# Patient Record
Sex: Male | Born: 1985 | Race: Black or African American | Hispanic: No | Marital: Single | Smoking: Former smoker
Health system: Southern US, Community
[De-identification: ages and names within clinical notes are randomized; demographics above are authoritative.]

---

## 2004-12-02 ENCOUNTER — Emergency Department (HOSPITAL_COMMUNITY): Admission: EM | Admit: 2004-12-02 | Discharge: 2004-12-02 | Payer: Self-pay | Admitting: Emergency Medicine

## 2006-10-26 ENCOUNTER — Emergency Department (HOSPITAL_COMMUNITY): Admission: EM | Admit: 2006-10-26 | Discharge: 2006-10-26 | Payer: Self-pay | Admitting: Emergency Medicine

## 2007-06-19 ENCOUNTER — Encounter: Admission: RE | Admit: 2007-06-19 | Discharge: 2007-06-19 | Payer: Self-pay | Admitting: Emergency Medicine

## 2007-06-21 ENCOUNTER — Ambulatory Visit (HOSPITAL_BASED_OUTPATIENT_CLINIC_OR_DEPARTMENT_OTHER): Admission: RE | Admit: 2007-06-21 | Discharge: 2007-06-21 | Payer: Self-pay | Admitting: Orthopedic Surgery

## 2008-05-21 ENCOUNTER — Emergency Department (HOSPITAL_COMMUNITY): Admission: EM | Admit: 2008-05-21 | Discharge: 2008-05-21 | Payer: Self-pay | Admitting: Emergency Medicine

## 2011-03-09 NOTE — Op Note (Signed)
Hunter Benson, Hunter Benson                ACCOUNT NO.:  [id number]   MEDICAL RECORD NO.:  [id number]          PATIENT TYPE:  AMB   LOCATION:  DSC                          FACILITY:  [facility]   PHYSICIAN:  Feliberto Gottron. Turner Daniels, M.D.   DATE OF BIRTH:  09-09-1986   DATE OF PROCEDURE:  06/21/2007  DATE OF DISCHARGE:                               OPERATIVE REPORT   PREOPERATIVE DIAGNOSIS:  Right middle finger fracture dislocation of the  proximal interphalangeal joint closed.   POSTOPERATIVE DIAGNOSIS:  Right middle finger fracture dislocation of  the proximal interphalangeal joint closed.   PROCEDURE:  Open reduction internal fixation of right middle finger  mallet fracture dislocation involving about 50% of the articular  surface.  We used a single 0.035 K-wire.   SURGEON:  Feliberto Gottron. Turner Daniels, M.D.   FIRST ASSISTANT:  None.   ANESTHETIC:  General LMA.   ESTIMATED BLOOD LOSS:  Minimal.   FLUID REPLACEMENT:  800 mL crystalloid.   DRAINS PLACED:  None.   TOURNIQUET TIME:  About 20 minutes.   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  X-rays were taken showing a fracture-  dislocation of the DIP joint of the right middle finger.  The dorsal  fragment involved about 50% of the joint surface and in order to  decrease pain and increase function, he is taken for open reduction  internal fixation preferably with a K-wire technique.  Risks and  benefits of surgery discussed, questions answered.   DESCRIPTION OF PROCEDURE:  The patient identified by armband, taken the  operating room at [facility] day surgery center where the appropriate  anesthetic monitors were attached and general LMA anesthesia induced  with the patient in supine position.  He  received 1 gram of Ancef  preoperatively.  The right hand was then prepped, draped in usual  sterile fashion from the fingertips to mid forearm and we began the  procedure by putting a small glove finger on the middle finger of his  right hand and rolling it down, fashioning a tourniquet.  We then made a  dorsal chevron incision starting out just proximal to the nail cuticle  going diagonally across to the DIP joint proximally on the ulnar side  and then taking the chevron back towards the ulnar side as we went  proximally about another centimeter.  A dorsal flap was developed down  to the level of the extensor tendon.  We then carefully elevated the  extensor tendon out of the fracture bed with the major fracture fragment  attached and cleansed out the fracture site.  The fragment  preoperatively was dorsally located about 2-3 mm and the volar fragment  was dislocated volarly about 2-3 mm.  After cleansing the fracture site,  we were able to reduce the volar fragment back down to the distal  condyles of  the middle phalanx and then T'd the dorsal fragment with the  bone attached to the volar fragment and performed C-arm imaging,  confirming the reduction.  We then once again took the dorsal fragment  out, hyperflexed the finger and drove a K-wire from proximal to distal  through the distal phalanx, bringing it out through the tip of the  finger and withdrawing it so that we could reinsert it from distal to  proximal and shish kebob the dorsal fragment.  This was accomplished  under C-arm image control and the dislocation was reduced.  The dorsal  fragment was brought within 1 mm of the volar fragment and the fragment  was stable to probing.  We then used a single figure-of-eight suture to  further reinforce the repair between the distal tendon and the proximal  tendon.  The tourniquet was let down.  Small bleeders were cauterized  with the bipolar.  The chevron incision was closed with  running 5-0  nylon suture.  A dressing of Xeroform, 2 x 2 dressing sponges, 1 inch  baby Kerlix and 1-inch Coban was then applied.  The patient was then  awakened and taken to the recovery room without difficulty.      Feliberto Gottron. Turner Daniels, M.D.  Electronically Signed     FJR/MEDQ  D:  06/21/2007  T:  06/22/2007  Job:  [id number]

## 2011-07-23 LAB — POCT I-STAT, CHEM 8
BUN: 12
Glucose, Bld: 87
HCT: 43
Hemoglobin: 14.6
Potassium: 3.9
Sodium: 139
TCO2: 29

## 2011-07-23 LAB — DIFFERENTIAL
Basophils Absolute: 0
Eosinophils Absolute: 0.1
Lymphs Abs: 2.6
Monocytes Absolute: 0.6
Monocytes Relative: 11
Neutrophils Relative %: 37 — ABNORMAL LOW

## 2011-07-23 LAB — CBC
HCT: 44
RBC: 5.07

## 2011-07-23 LAB — SAMPLE TO BLOOD BANK

## 2011-08-06 LAB — POCT HEMOGLOBIN-HEMACUE: Operator id: 112821

## 2022-08-04 ENCOUNTER — Ambulatory Visit
Admission: EM | Admit: 2022-08-04 | Discharge: 2022-08-04 | Disposition: A | Discharge status: Home / Self Care | Payer: BC Managed Care – PPO | Attending: Physician Assistant | Admitting: Physician Assistant

## 2022-08-04 ENCOUNTER — Ambulatory Visit (INDEPENDENT_AMBULATORY_CARE_PROVIDER_SITE_OTHER): Payer: BC Managed Care – PPO

## 2022-08-04 DIAGNOSIS — M25512 Pain in left shoulder: Secondary | ICD-10-CM

## 2022-08-04 MED ORDER — NAPROXEN 500 MG PO TABS: 500.0000 mg | ORAL_TABLET | Freq: Two times a day (BID) | ORAL | 0 refills | Status: AC

## 2022-08-04 NOTE — ED Triage Notes (Signed)
Pt c/o LT shoulder pain x2.5 months, denies any injury.

## 2022-08-04 NOTE — ED Provider Notes (Signed)
[facility]    CSN: [id number] Arrival date & time: 08/04/22  1709      History   Chief Complaint Chief Complaint  Patient presents with   Shoulder Pain    LT    HPI Hunter Benson is a 36 y.o. male.   Patient is a 36 year old male who presents with chief complaint of left shoulder pain ongoing for about 2-2.5 months.  Patient denies any known injuries but states he felt like he slept on it wrong 1 night and has had issues with that since.  Patient denies any recurrent sports participation that may have aggravated his shoulder.  Patient reports decreased range of motion due to pain.  Patient took some Tylenol last night but is not done any thing else for his pain.    History reviewed. No pertinent past medical history.  There are no problems to display for this patient.   History reviewed. No pertinent surgical history.     Home Medications    Prior to Admission medications   Medication Sig Start Date End Date Taking? Authorizing Provider  naproxen (NAPROSYN) 500 MG tablet Take 1 tablet (500 mg total) by mouth 2 (two) times daily. 08/04/22  Yes Candis Schatz, PA-C    Family History History reviewed. No pertinent family history.  Social History Social History   Tobacco Use   Smoking status: Some Days    Types: Cigarettes   Smokeless tobacco: Never  Vaping Use   Vaping Use: Never used  Substance Use Topics   Alcohol use: Never   Drug use: Never     Allergies   Doxycycline   Review of Systems Review of Systems as above in HPI.  Other systems reviewed and found to be negative   Physical Exam Triage Vital Signs ED Triage Vitals  Enc Vitals Group     BP 08/04/22 1728 108/69     Pulse Rate 08/04/22 1728 61     Resp --      Temp 08/04/22 1728 98 F (36.7 C)     Temp src --      SpO2 08/04/22 1728 99 %     Weight 08/04/22 1727 136 lb (61.7 kg)     Height 08/04/22 1727 5\' 7"  (1.702 m)     Head Circumference --      Peak Flow --       Pain Score 08/04/22 1727 10     Pain Loc --      Pain Edu? --      Excl. in GC? --    No data found.  Updated Vital Signs BP 108/69 (BP Location: Right Arm)   Pulse 61   Temp 98 F (36.7 C)   Ht 5\' 7"  (1.702 m)   Wt 136 lb (61.7 kg)   SpO2 99%   BMI 21.30 kg/m   Visual Acuity Right Eye Distance:   Left Eye Distance:   Bilateral Distance:    Right Eye Near:   Left Eye Near:    Bilateral Near:     Physical Exam Constitutional:      Appearance: Normal appearance.  Musculoskeletal:     Right shoulder: Normal.     Left shoulder: Tenderness (to joint and with passive ROM) present. No effusion. Normal range of motion (some limit with pain but full passive range of motion).     Comments: Some tenderness to joint palpation. Reports tenderness with taking off jacket. Weakness with extended arm as well as oil can  test  Skin:    Capillary Refill: Capillary refill takes less than 2 seconds.  Neurological:     General: No focal deficit present.     Mental Status: He is alert and oriented to person, place, and time.  Psychiatric:        Mood and Affect: Mood normal.        Behavior: Behavior normal.      UC Treatments / Results  Labs (all labs ordered are listed, but only abnormal results are displayed) Labs Reviewed - No data to display  EKG   Radiology DG Shoulder Left  Result Date: 08/04/2022 CLINICAL DATA:  Pain EXAM: LEFT SHOULDER - 2+ VIEW COMPARISON:  None Available. FINDINGS: There is no evidence of fracture or dislocation. There is no evidence of arthropathy or other focal bone abnormality. Soft tissues are unremarkable. IMPRESSION: No radiographic abnormalities are seen in left shoulder. Electronically Signed   By: Elmer Picker M.D.   On: 08/04/2022 18:26    Procedures Procedures (including critical care time)  Medications Ordered in UC Medications - No data to display  Initial Impression / Assessment and Plan / UC Course  I have reviewed the  triage vital signs and the nursing notes.  Pertinent labs & imaging results that were available during my care of the patient were reviewed by me and considered in my medical decision making (see chart for details).    Patient presents with left shoulder pain ongoing for about 2-1/2 months.  Patient reports sleeping on it wrong prior and pain has been ongoing since then.  Patient denies any recurrent sporting activities or other injuries.  Some tenderness to palpation of the joint.  Patient has full range of motion on passive testing but reports decreased range of motion with active movements.  Decreased strength with all can test and straight arm.  Check x-ray of the shoulder.  If negative will give him some anti-inflammatory and refer him to orthopedics. Final Clinical Impressions(s) / UC Diagnoses   Final diagnoses:  Left shoulder pain, unspecified chronicity     Discharge Instructions      -Naproxen: 1 tablet twice a day as anti-inflammatory and pain reducer -Can also take Tylenol for pain.  Do not take ibuprofen/Advil while taking the naproxen -Shoulder exercises as attached -Would limit any aggravating activities -If no improvement with the shoulder exercises and anti-inflammatories, would follow-up with orthopedics     ED Prescriptions     Medication Sig Dispense Auth. Provider   naproxen (NAPROSYN) 500 MG tablet Take 1 tablet (500 mg total) by mouth 2 (two) times daily. 30 tablet Luvenia Redden, PA-C      PDMP not reviewed this encounter.   Luvenia Redden, PA-C 08/04/22 1830

## 2022-08-04 NOTE — Discharge Instructions (Addendum)
-  X-ray was negative for any bone abnormalities. -Naproxen: 1 tablet twice a day as anti-inflammatory and pain reducer -Can also take Tylenol for pain.  Do not take ibuprofen/Advil while taking the naproxen -Shoulder exercises as attached -Would limit any aggravating activities -If no improvement with the shoulder exercises and anti-inflammatories, would follow-up with orthopedics

## 2022-09-14 ENCOUNTER — Ambulatory Visit
Admission: EM | Admit: 2022-09-14 | Discharge: 2022-09-14 | Disposition: A | Discharge status: Home / Self Care | Payer: BC Managed Care – PPO | Attending: Family Medicine | Admitting: Family Medicine

## 2022-09-14 DIAGNOSIS — R6889 Other general symptoms and signs: Secondary | ICD-10-CM

## 2022-09-14 DIAGNOSIS — J111 Influenza due to unidentified influenza virus with other respiratory manifestations: Secondary | ICD-10-CM

## 2022-09-14 DIAGNOSIS — Z20822 Contact with and (suspected) exposure to covid-19: Secondary | ICD-10-CM | POA: Diagnosis present

## 2022-09-14 LAB — RESP PANEL BY RT-PCR (FLU A&B, COVID) ARPGX2
Influenza A by PCR: POSITIVE — AB
Influenza B by PCR: NEGATIVE
SARS Coronavirus 2 by RT PCR: NEGATIVE

## 2022-09-14 MED ORDER — ACETAMINOPHEN 500 MG PO TABS
1000.0000 mg | ORAL_TABLET | Freq: Once | ORAL | Status: AC
  Administered 2022-09-14: 1000 mg via ORAL

## 2022-09-14 MED ORDER — OSELTAMIVIR PHOSPHATE 75 MG PO CAPS: 75.0000 mg | ORAL_CAPSULE | Freq: Two times a day (BID) | ORAL | 0 refills | Status: AC

## 2022-09-14 MED ORDER — IBUPROFEN 400 MG PO TABS: 400.0000 mg | ORAL_TABLET | Freq: Four times a day (QID) | ORAL | 0 refills | Status: AC | PRN

## 2022-09-14 MED ORDER — PROMETHAZINE-DM 6.25-15 MG/5ML PO SYRP: 5.0000 mL | ORAL_SOLUTION | Freq: Four times a day (QID) | ORAL | 0 refills | Status: AC | PRN

## 2022-09-14 NOTE — ED Triage Notes (Signed)
Pt c/o bodyaches, headache, cough, fatigue, loss of appetite x3days  Pt is worried about the flu.  Pt has taken theraflu and states that it has not helped.   Pt states he has not taken any tylenol for fever.

## 2022-09-14 NOTE — Discharge Instructions (Signed)
We will contact you if your COVID influenza test is positive.  Please quarantine while you wait for the results.  If your test is negative you may resume normal activities.  If your test is positive please continue to quarantine for at least 5 days from your symptom onset or until you are without a fever for at least 24 hours after the medications.   You can take Tylenol and/or Ibuprofen as needed for fever reduction and pain relief.    For cough: honey 1/2 to 1 teaspoon (you can dilute the honey in water or another fluid).  You can also use guaifenesin and dextromethorphan for cough. You can use a humidifier for chest congestion and cough.  If you don't have a humidifier, you can sit in the bathroom with the hot shower running.      For sore throat: try warm salt water gargles, cepacol lozenges, throat spray, warm tea or water with lemon/honey, popsicles or ice, or OTC cold relief medicine for throat discomfort.    For congestion: take a daily anti-histamine like Zyrtec, Claritin, and a oral decongestant, such as pseudoephedrine.  You can also use Flonase 1-2 sprays in each nostril daily.    It is important to stay hydrated: drink plenty of fluids (water, gatorade/powerade/pedialyte, juices, or teas) to keep your throat moisturized and help further relieve irritation/discomfort.    Return or go to the Emergency Department if symptoms worsen or do not improve in the next few days  

## 2022-09-14 NOTE — ED Provider Notes (Addendum)
[facility]    CSN: [id number] Arrival date & time: 09/14/22  1155      History   Chief Complaint Chief Complaint  Patient presents with   Cough   Headache   Generalized Body Aches    HPI Kuba Shepherd is a 36 y.o. male.   HPI   Chas presents for cough, headache, chills and body aches. Headache and coughing started Monday and the next morning "everything started going downhill."  He has been coughing all night. Took some Theraflu without relief. He slept a lot yesterday.  No one else has been sick.  No vomiting, diarrhea, ear pain. Endorses sore throat. Told he had a fever here.      History reviewed. No pertinent past medical history.  There are no problems to display for this patient.   History reviewed. No pertinent surgical history.     Home Medications    Prior to Admission medications   Medication Sig Start Date End Date Taking? Authorizing Provider  ibuprofen (ADVIL) 400 MG tablet Take 1 tablet (400 mg total) by mouth every 6 (six) hours as needed. 09/14/22  Yes Janee Ureste, DO  naproxen (NAPROSYN) 500 MG tablet Take 1 tablet (500 mg total) by mouth 2 (two) times daily. 08/04/22  Yes Candis Schatz, PA-C  oseltamivir (TAMIFLU) 75 MG capsule Take 1 capsule (75 mg total) by mouth every 12 (twelve) hours. 09/14/22  Yes Heiley Shaikh, DO  promethazine-dextromethorphan (PROMETHAZINE-DM) 6.25-15 MG/5ML syrup Take 5 mLs by mouth 4 (four) times daily as needed. 09/14/22  Yes Katha Cabal, DO    Family History History reviewed. No pertinent family history.  Social History Social History   Tobacco Use   Smoking status: Former    Types: Cigarettes   Smokeless tobacco: Never  Vaping Use   Vaping Use: Never used  Substance Use Topics   Alcohol use: Never   Drug use: Never     Allergies   Doxycycline   Review of Systems Review of Systems: negative unless otherwise stated in HPI.      Physical Exam Triage Vital Signs ED  Triage Vitals  Enc Vitals Group     BP 09/14/22 1251 (!) 115/90     Pulse Rate 09/14/22 1251 81     Resp 09/14/22 1251 18     Temp 09/14/22 1251 (!) 100.8 F (38.2 C)     Temp Source 09/14/22 1251 Oral     SpO2 09/14/22 1251 100 %     Weight 09/14/22 1250 140 lb (63.5 kg)     Height 09/14/22 1250 5\' 7"  (1.702 m)     Head Circumference --      Peak Flow --      Pain Score 09/14/22 1250 8     Pain Loc --      Pain Edu? --      Excl. in GC? --    No data found.  Updated Vital Signs BP (!) 115/90 (BP Location: Left Arm)   Pulse 81   Temp (!) 100.8 F (38.2 C) (Oral)   Resp 18   Ht 5\' 7"  (1.702 m)   Wt 63.5 kg   SpO2 100%   BMI 21.93 kg/m   Visual Acuity Right Eye Distance:   Left Eye Distance:   Bilateral Distance:    Right Eye Near:   Left Eye Near:    Bilateral Near:     Physical Exam GEN:     alert, non-toxic appearing male in no  distress     HENT:  mucus membranes moist, oropharyngeal without lesions or exudate, no tonsillar hypertrophy,  mild oropharyngeal erythema, clear nasal discharge, bilateral TM normal EYES:   pupils equal and reactive, EOMi, no scleral injection NECK:  normal ROM, no lymphadenopathy, no meningismus   RESP:  no increased work of breathing, clear to auscultation bilaterally CVS:   regular rate and rhythm Skin:   warm and dry, no rash on visible skin, normal skin turgor    UC Treatments / Results  Labs (all labs ordered are listed, but only abnormal results are displayed) Labs Reviewed  RESP PANEL BY RT-PCR (FLU A&B, COVID) ARPGX2 - Abnormal; Notable for the following components:      Result Value   Influenza A by PCR POSITIVE (*)    All other components within normal limits    EKG   Radiology No results found.  Procedures Procedures (including critical care time)  Medications Ordered in UC Medications  acetaminophen (TYLENOL) tablet 1,000 mg (1,000 mg Oral Given 09/14/22 1311)    Initial Impression / Assessment and  Plan / UC Course  I have reviewed the triage vital signs and the nursing notes.  Pertinent labs & imaging results that were available during my care of the patient were reviewed by me and considered in my medical decision making (see chart for details).       Pt is a 36 y.o. male who presents for 2 days of respiratory symptoms. Olusegun is febrile here and given Tylenol. Satting well on room air. Overall pt is well appearing, well hydrated, without respiratory distress. Pulmonary exam is unremarkable.  COVID and influenza testing obtained. Pt to quarantine until COVID/influenza test results or longer if positive.  I will call patient with test results, if positive. History consistent with viral respiratory illness. Discussed symptomatic treatment.  Explained lack of efficacy of antibiotics in viral disease.  Typical duration of symptoms discussed. Return and ED precautions given and patient voiced understanding.  Patient's flu a test was positive.  COVID-negative.  Called patient and updated him on his test result's.  He is interested in treatment for the flu he is within treatment window.  Tamiflu sent to his preferred pharmacy.  Reviewed reasons to go to the emergency department or return to the care and he voiced understanding. Discussed MDM, treatment plan and plan for follow-up with patient who agrees with plan.     Final Clinical Impressions(s) / UC Diagnoses   Final diagnoses:  Encounter for laboratory testing for COVID-19 virus  Influenza with respiratory manifestation     Discharge Instructions      We will contact you if your COVID/influenza test is positive.  Please quarantine while you wait for the results.  If your test is negative you may resume normal activities.  If your test is positive please continue to quarantine for at least 5 days from your symptom onset or until you are without a fever for at least 24 hours after the medications.   You can take Tylenol and/or  Ibuprofen as needed for fever reduction and pain relief.    For cough: honey 1/2 to 1 teaspoon (you can dilute the honey in water or another fluid).  You can also use guaifenesin and dextromethorphan for cough. You can use a humidifier for chest congestion and cough.  If you don't have a humidifier, you can sit in the bathroom with the hot shower running.      For sore throat: try warm salt  water gargles, cepacol lozenges, throat spray, warm tea or water with lemon/honey, popsicles or ice, or OTC cold relief medicine for throat discomfort.    For congestion: take a daily anti-histamine like Zyrtec, Claritin, and a oral decongestant, such as pseudoephedrine.  You can also use Flonase 1-2 sprays in each nostril daily.    It is important to stay hydrated: drink plenty of fluids (water, gatorade/powerade/pedialyte, juices, or teas) to keep your throat moisturized and help further relieve irritation/discomfort.    Return or go to the Emergency Department if symptoms worsen or do not improve in the next few days      ED Prescriptions     Medication Sig Dispense Auth. Provider   promethazine-dextromethorphan (PROMETHAZINE-DM) 6.25-15 MG/5ML syrup Take 5 mLs by mouth 4 (four) times daily as needed. 118 mL Daniella Dewberry, DO   ibuprofen (ADVIL) 400 MG tablet Take 1 tablet (400 mg total) by mouth every 6 (six) hours as needed. 30 tablet Barrington Worley, DO   oseltamivir (TAMIFLU) 75 MG capsule Take 1 capsule (75 mg total) by mouth every 12 (twelve) hours. 10 capsule Katha Cabal, DO      PDMP not reviewed this encounter.   Katha Cabal, DO 09/14/22 1326    Katha Cabal, DO 09/14/22 1456
